# Patient Record
Sex: Male | Born: 1980
Health system: Southern US, Community
[De-identification: ages and names within clinical notes are randomized; demographics above are authoritative.]

## PROBLEM LIST (undated history)

## (undated) DIAGNOSIS — G8929 Other chronic pain: Secondary | ICD-10-CM

## (undated) HISTORY — PX: MYRINGOTOMY: SUR874

## (undated) HISTORY — PX: DENTAL SURGERY: SHX609

---

## 2009-11-19 ENCOUNTER — Encounter: Admission: RE | Admit: 2009-11-19 | Discharge: 2009-11-19 | Payer: Self-pay | Admitting: Neurosurgery

## 2010-11-02 IMAGING — CT CT L SPINE W/ CM
3 of 15 series · 6 of 33 positions shown, 7 images · IV contrast (omnipaque)
Comparison: MRI of the lumbar spine from [HOSPITAL] 06/06/2009.

Addendum Begins

The contrast material injected was 10 ml of Omnipaque 300.
Addendum Ends
CLINICAL DATA: Neck pain and associated headaches.  Low back pain
with extension to the left lower extremity.
MYELOGRAM INJECTION
TECHNIQUE: Informed consent was obtained from the patient prior to
the procedure, including potential complications of headache,
allergy, infection and pain.  A timeout procedure was performed.
With the patient prone, the lower back was prepped with Betadine.
1% Lidocaine was used for local anesthesia.  Lumbar puncture was
performed at the right paramidline level using a 22 gauge needle
with return of clear CSF.  15 ml of Omnipaque 840was injected into
the subarachnoid space .
TECHNIQUE: Following injection of intrathecal Omnipaque contrast,
spine imaging in multiple projections was performed using
fluoroscopy.
Fluoroscopy Time: 2.08 minutes.
TECHNIQUE: CT imaging of the cervical spine was performed after
intrathecal contrast administration. Multiplanar CT image
reconstructions were also generated.
TECHNIQUE: CT imaging of the lumbar spine was performed after
intrathecal contrast administration.  Multiplanar CT image

[Series 6: l spine bone · axial · 0.27mm/px · z∈[-274,-44]mm · 3 of 93 slices shown, 4 images]
[im 1/93  soft-tissue]
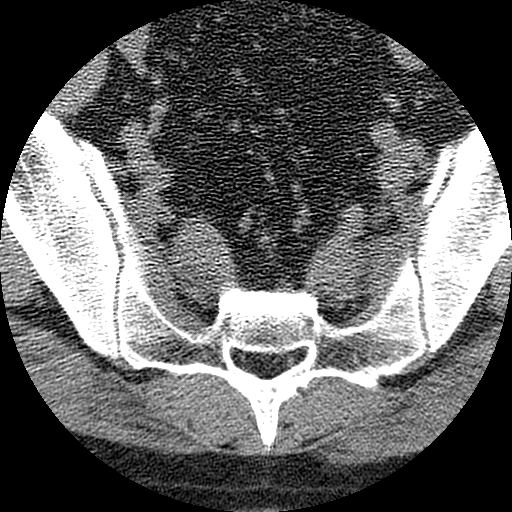
[im 1/93  bone]
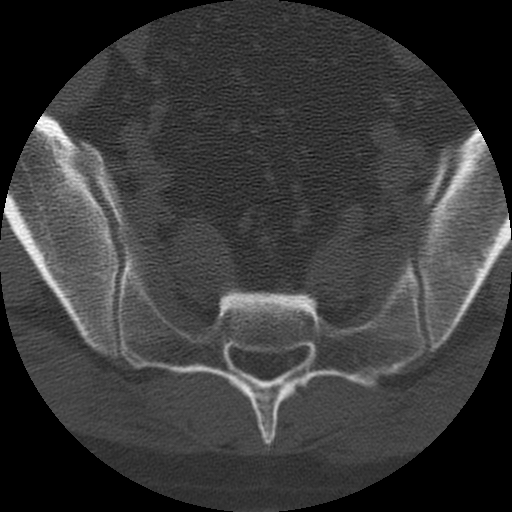
[im 47/93  bone]
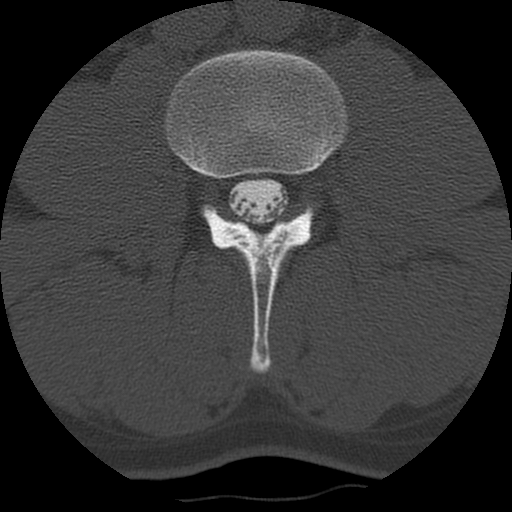
[im 93/93  bone]
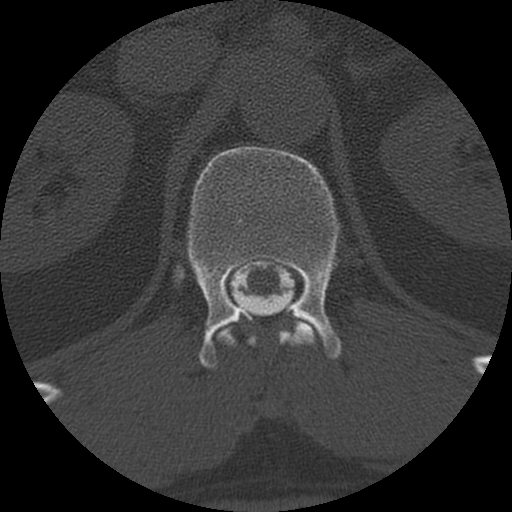

[Series 400: cor c-spine · coronal · 0.37mm/px · 2 of 31 slices shown]
[im 11/31  bone]
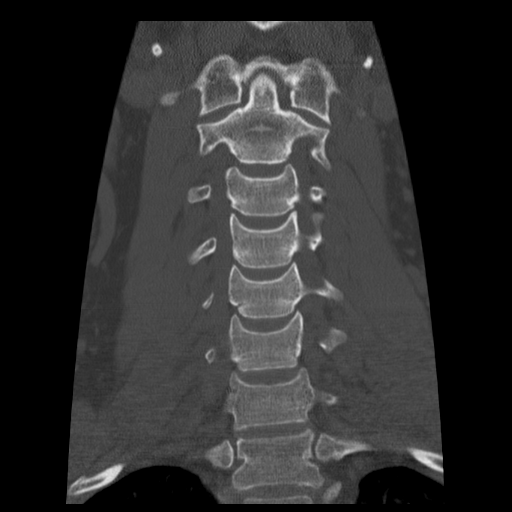
[im 21/31  bone]
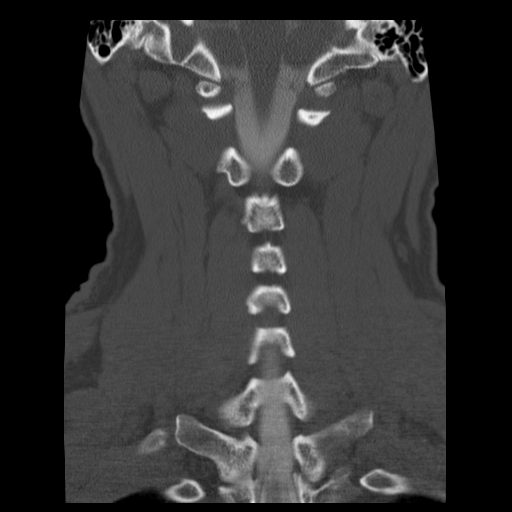

[Series 801: cor lower l-spine · coronal · 0.46mm/px · 1 of 40 slices shown]
[im 20/40  bone]
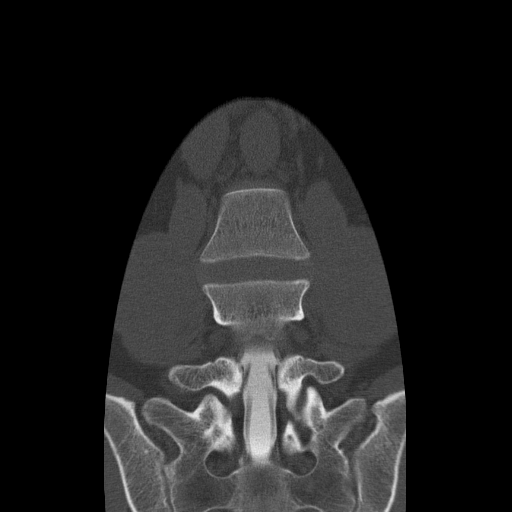

[6 of 33 positions shown; findings below may reference images not displayed]

IMPRESSION: Successful injection of  intrathecal contrast for myelography.

MYELOGRAM CERVICAL AND LUMBAR
FINDINGS: Minimal disc bulging and some end plate irregularity is
evident at L4-5.  The nerve roots fill appropriately on both sides.
Alignment is anatomic.  The vertebral body heights are maintained.
Slight endplate irregularity is noted at L4-5.  No other
significant disc disease is present.

The nerve roots of the cervical spine are opacified normally on
both sides.  The lateral view demonstrates minimal disc bulging at
C4-5.  No other significant disease is evident.
IMPRESSION: 1.  Minimal disc bulging at L4-5 without significant stenosis or
nerve root disruption.
2.  Minimal disc osteophyte complex at C4-5.

CT MYELOGRAPHY CERVICAL SPINE
FINDINGS: The cervical spine is imaged from the skull base through
T1-2.  The vertebral body heights and alignment maintained.
Craniocervical junction is within normal limits.  The soft tissues
are unremarkable.  Individual disc levels are as follows.

C2-3:  Negative.

C3-4:  Minimal uncovertebral spurring is present.  Slight facet
hypertrophy is seen as well.  The central canal is patent.  Minimal
foraminal narrowing is worse on the right.

C4-5:  A slight disc osteophyte complex is present.  Uncovertebral
spurring is worse on the left.  Minimal left foraminal narrowing is
evident.  The central canal and right foramen are patent.

C5-6:  A slight central disc bulge is evident.  This partially
effaces the ventral CSF without significant impact on the cord.
Mild left foraminal narrowing is secondary to uncovertebral
disease.

C6-7:  Negative.

C7-T1:  Negative.
IMPRESSION: 1.  Minimal spondylosis as described.
2.  Minimal foraminal narrowing bilaterally at C3-4, worse on the
right.
3.  Minimal left foraminal narrowing at C4-5 and C5-6 due to
uncovertebral disease and facet hypertrophy.

CT MYELOGRAPHY LUMBAR SPINE
FINDINGS: The lumbar spine is imaged from midbody of T12-S2.
Schmorl's nodes are present at T12-L1.  There is a shallow
Schmorl's node along the superior endplate of L2.  A bone fragment
is seen anteriorly along the superior endplate of L5.  No other
significant ring apophyses are evident.  This may represent remote
trauma.  The vertebral body heights are otherwise maintained.
Alignment is anatomic.  The conus medullaris terminates at an
appropriate level, L1.  Individual disc levels are as follows.
Individual disc levels are as follows.

T12-L1:  Minimal disc bulge is present.  No focal stenosis is
evident.

L1-2:  Negative.

L2-3:  Negative.

L3-4:  Negative.

L4-5:  Slight broad-based disc bulging is present.  This distorts
the central canal without significant to focal stenosis.  Mild
facet hypertrophy contributes.

L5-S1:  Minimal disc bulging is present.  Mild facet hypertrophy is
worse on the right.  No focal stenosis is present.
IMPRESSION: 1.  Slight broad-based disc bulging at L4-5 with minimal distortion
of the central canal but no focal stenosis.
2.  Mild facet hypertrophy at L4-5 and L5-S1 without focal stenosis
at either level.
3.  Fragmentation of the bone along the anterior aspect of the L5
vertebral body.  This may be congenital or related to remote
trauma.  Please see the above discussion.

## 2015-12-27 DIAGNOSIS — J0121 Acute recurrent ethmoidal sinusitis: Secondary | ICD-10-CM | POA: Diagnosis not present

## 2016-05-30 DIAGNOSIS — S20462A Insect bite (nonvenomous) of left back wall of thorax, initial encounter: Secondary | ICD-10-CM | POA: Diagnosis not present

## 2016-05-30 DIAGNOSIS — M542 Cervicalgia: Secondary | ICD-10-CM | POA: Diagnosis not present

## 2016-11-24 DIAGNOSIS — J019 Acute sinusitis, unspecified: Secondary | ICD-10-CM | POA: Diagnosis not present

## 2017-10-12 DIAGNOSIS — J0101 Acute recurrent maxillary sinusitis: Secondary | ICD-10-CM | POA: Diagnosis not present

## 2017-12-09 DIAGNOSIS — H9012 Conductive hearing loss, unilateral, left ear, with unrestricted hearing on the contralateral side: Secondary | ICD-10-CM | POA: Diagnosis not present

## 2017-12-09 DIAGNOSIS — H7202 Central perforation of tympanic membrane, left ear: Secondary | ICD-10-CM | POA: Diagnosis not present

## 2018-01-18 ENCOUNTER — Ambulatory Visit (INDEPENDENT_AMBULATORY_CARE_PROVIDER_SITE_OTHER): Payer: Medicare Other | Admitting: Otolaryngology

## 2018-01-18 DIAGNOSIS — H7202 Central perforation of tympanic membrane, left ear: Secondary | ICD-10-CM | POA: Diagnosis not present

## 2018-01-18 DIAGNOSIS — H9072 Mixed conductive and sensorineural hearing loss, unilateral, left ear, with unrestricted hearing on the contralateral side: Secondary | ICD-10-CM | POA: Diagnosis not present

## 2018-05-17 ENCOUNTER — Ambulatory Visit (INDEPENDENT_AMBULATORY_CARE_PROVIDER_SITE_OTHER): Payer: Medicare Other | Admitting: Otolaryngology

## 2018-05-17 DIAGNOSIS — H6122 Impacted cerumen, left ear: Secondary | ICD-10-CM | POA: Diagnosis not present

## 2018-05-17 DIAGNOSIS — H7202 Central perforation of tympanic membrane, left ear: Secondary | ICD-10-CM | POA: Diagnosis not present

## 2020-07-31 DIAGNOSIS — H7202 Central perforation of tympanic membrane, left ear: Secondary | ICD-10-CM | POA: Diagnosis not present

## 2020-07-31 DIAGNOSIS — H9012 Conductive hearing loss, unilateral, left ear, with unrestricted hearing on the contralateral side: Secondary | ICD-10-CM | POA: Diagnosis not present

## 2020-08-01 ENCOUNTER — Other Ambulatory Visit: Payer: Self-pay | Admitting: Otolaryngology

## 2020-08-03 ENCOUNTER — Other Ambulatory Visit: Payer: Self-pay

## 2020-08-03 ENCOUNTER — Encounter (HOSPITAL_BASED_OUTPATIENT_CLINIC_OR_DEPARTMENT_OTHER): Payer: Self-pay | Admitting: Otolaryngology

## 2020-08-07 ENCOUNTER — Other Ambulatory Visit (HOSPITAL_COMMUNITY)
Admission: RE | Admit: 2020-08-07 | Discharge: 2020-08-07 | Disposition: A | Discharge status: Home / Self Care | Payer: Medicare Other | Source: Ambulatory Visit | Attending: Otolaryngology | Admitting: Otolaryngology

## 2020-08-07 DIAGNOSIS — Z01812 Encounter for preprocedural laboratory examination: Secondary | ICD-10-CM | POA: Diagnosis not present

## 2020-08-07 DIAGNOSIS — Z20822 Contact with and (suspected) exposure to covid-19: Secondary | ICD-10-CM | POA: Insufficient documentation

## 2020-08-07 LAB — SARS CORONAVIRUS 2 (TAT 6-24 HRS): SARS Coronavirus 2: NEGATIVE

## 2020-08-10 ENCOUNTER — Ambulatory Visit (HOSPITAL_BASED_OUTPATIENT_CLINIC_OR_DEPARTMENT_OTHER): Payer: Medicare Other | Admitting: Anesthesiology

## 2020-08-10 ENCOUNTER — Encounter (HOSPITAL_BASED_OUTPATIENT_CLINIC_OR_DEPARTMENT_OTHER): Payer: Self-pay | Admitting: Otolaryngology

## 2020-08-10 ENCOUNTER — Ambulatory Visit (HOSPITAL_BASED_OUTPATIENT_CLINIC_OR_DEPARTMENT_OTHER)
Admission: RE | Admit: 2020-08-10 | Discharge: 2020-08-10 | Disposition: A | Discharge status: Home / Self Care | Payer: Medicare Other | Attending: Otolaryngology | Admitting: Otolaryngology

## 2020-08-10 ENCOUNTER — Other Ambulatory Visit: Payer: Self-pay

## 2020-08-10 ENCOUNTER — Encounter (HOSPITAL_BASED_OUTPATIENT_CLINIC_OR_DEPARTMENT_OTHER)
Admission: RE | Disposition: A | Discharge status: Home / Self Care | Payer: Self-pay | Source: Home / Self Care | Attending: Otolaryngology

## 2020-08-10 DIAGNOSIS — Z87891 Personal history of nicotine dependence: Secondary | ICD-10-CM | POA: Insufficient documentation

## 2020-08-10 DIAGNOSIS — H7292 Unspecified perforation of tympanic membrane, left ear: Secondary | ICD-10-CM | POA: Insufficient documentation

## 2020-08-10 DIAGNOSIS — H7202 Central perforation of tympanic membrane, left ear: Secondary | ICD-10-CM | POA: Diagnosis not present

## 2020-08-10 DIAGNOSIS — H9012 Conductive hearing loss, unilateral, left ear, with unrestricted hearing on the contralateral side: Secondary | ICD-10-CM | POA: Insufficient documentation

## 2020-08-10 HISTORY — PX: TYMPANOPLASTY: SHX33

## 2020-08-10 HISTORY — DX: Other chronic pain: G89.29

## 2020-08-10 SURGERY — TYMPANOPLASTY
Anesthesia: General | Site: Ear | Laterality: Left

## 2020-08-10 MED ORDER — LIDOCAINE 2% (20 MG/ML) 5 ML SYRINGE
INTRAMUSCULAR | Status: DC | PRN
  Administered 2020-08-10: 100 mg via INTRAVENOUS

## 2020-08-10 MED ORDER — EPHEDRINE 5 MG/ML INJ
INTRAVENOUS | Status: AC
  Filled 2020-08-10: qty 10

## 2020-08-10 MED ORDER — CIPROFLOXACIN-FLUOCINOLONE PF 0.3-0.025 % OT SOLN
OTIC | Status: AC
  Filled 2020-08-10: qty 0.25

## 2020-08-10 MED ORDER — DEXMEDETOMIDINE (PRECEDEX) IN NS 20 MCG/5ML (4 MCG/ML) IV SYRINGE
PREFILLED_SYRINGE | INTRAVENOUS | Status: DC | PRN
  Administered 2020-08-10: 12 ug via INTRAVENOUS
  Administered 2020-08-10: 4 ug via INTRAVENOUS

## 2020-08-10 MED ORDER — LIDOCAINE 2% (20 MG/ML) 5 ML SYRINGE
INTRAMUSCULAR | Status: AC
  Filled 2020-08-10: qty 5

## 2020-08-10 MED ORDER — BACITRACIN ZINC 500 UNIT/GM EX OINT
TOPICAL_OINTMENT | CUTANEOUS | Status: AC
  Filled 2020-08-10: qty 28.35

## 2020-08-10 MED ORDER — MIDAZOLAM HCL 5 MG/5ML IJ SOLN
INTRAMUSCULAR | Status: DC | PRN
  Administered 2020-08-10: 2 mg via INTRAVENOUS

## 2020-08-10 MED ORDER — DEXAMETHASONE SODIUM PHOSPHATE 4 MG/ML IJ SOLN
INTRAMUSCULAR | Status: DC | PRN
  Administered 2020-08-10: 10 mg via INTRAVENOUS

## 2020-08-10 MED ORDER — OXYCODONE HCL 5 MG/5ML PO SOLN: 5.0000 mg | Freq: Once | ORAL | Status: DC | PRN

## 2020-08-10 MED ORDER — PROPOFOL 500 MG/50ML IV EMUL
INTRAVENOUS | Status: AC
  Filled 2020-08-10: qty 100

## 2020-08-10 MED ORDER — ONDANSETRON HCL 4 MG/2ML IJ SOLN
INTRAMUSCULAR | Status: DC | PRN
  Administered 2020-08-10: 4 mg via INTRAVENOUS

## 2020-08-10 MED ORDER — CIPROFLOXACIN-FLUOCINOLONE PF 0.3-0.025 % OT SOLN
OTIC | Status: DC | PRN
  Administered 2020-08-10: 1 mL via OTIC

## 2020-08-10 MED ORDER — CLINDAMYCIN HCL 300 MG PO CAPS: 300.0000 mg | ORAL_CAPSULE | Freq: Three times a day (TID) | ORAL | 0 refills | Status: AC

## 2020-08-10 MED ORDER — PHENYLEPHRINE 40 MCG/ML (10ML) SYRINGE FOR IV PUSH (FOR BLOOD PRESSURE SUPPORT)
PREFILLED_SYRINGE | INTRAVENOUS | Status: DC | PRN
  Administered 2020-08-10: 80 ug via INTRAVENOUS

## 2020-08-10 MED ORDER — CIPROFLOXACIN-DEXAMETHASONE 0.3-0.1 % OT SUSP
OTIC | Status: AC
  Filled 2020-08-10: qty 7.5

## 2020-08-10 MED ORDER — DEXAMETHASONE SODIUM PHOSPHATE 10 MG/ML IJ SOLN
INTRAMUSCULAR | Status: AC
  Filled 2020-08-10: qty 1

## 2020-08-10 MED ORDER — PROPOFOL 10 MG/ML IV BOLUS
INTRAVENOUS | Status: DC | PRN
  Administered 2020-08-10: 100 mg via INTRAVENOUS
  Administered 2020-08-10: 200 mg via INTRAVENOUS

## 2020-08-10 MED ORDER — ONDANSETRON HCL 4 MG/2ML IJ SOLN
INTRAMUSCULAR | Status: AC
  Filled 2020-08-10: qty 2

## 2020-08-10 MED ORDER — FENTANYL CITRATE (PF) 100 MCG/2ML IJ SOLN
INTRAMUSCULAR | Status: AC
  Filled 2020-08-10: qty 2

## 2020-08-10 MED ORDER — DEXMEDETOMIDINE (PRECEDEX) IN NS 20 MCG/5ML (4 MCG/ML) IV SYRINGE
PREFILLED_SYRINGE | INTRAVENOUS | Status: AC
  Filled 2020-08-10: qty 5

## 2020-08-10 MED ORDER — LIDOCAINE-EPINEPHRINE 1 %-1:100000 IJ SOLN
INTRAMUSCULAR | Status: DC | PRN
  Administered 2020-08-10: 3 mL

## 2020-08-10 MED ORDER — FENTANYL CITRATE (PF) 100 MCG/2ML IJ SOLN
INTRAMUSCULAR | Status: DC | PRN
Start: 2020-08-10 — End: 2020-08-10
  Administered 2020-08-10 (×2): 25 ug via INTRAVENOUS
  Administered 2020-08-10 (×3): 50 ug via INTRAVENOUS

## 2020-08-10 MED ORDER — BACITRACIN ZINC 500 UNIT/GM EX OINT
TOPICAL_OINTMENT | CUTANEOUS | Status: DC | PRN
  Administered 2020-08-10: 1 via TOPICAL

## 2020-08-10 MED ORDER — CLINDAMYCIN PHOSPHATE 900 MG/50ML IV SOLN
INTRAVENOUS | Status: AC
  Filled 2020-08-10: qty 50

## 2020-08-10 MED ORDER — CLINDAMYCIN PHOSPHATE 900 MG/50ML IV SOLN
INTRAVENOUS | Status: DC | PRN
  Administered 2020-08-10: 900 mg via INTRAVENOUS

## 2020-08-10 MED ORDER — LACTATED RINGERS IV SOLN: INTRAVENOUS | Status: DC

## 2020-08-10 MED ORDER — EPHEDRINE SULFATE-NACL 50-0.9 MG/10ML-% IV SOSY
PREFILLED_SYRINGE | INTRAVENOUS | Status: DC | PRN
  Administered 2020-08-10: 5 mg via INTRAVENOUS

## 2020-08-10 MED ORDER — MEPERIDINE HCL 25 MG/ML IJ SOLN: 6.2500 mg | INTRAMUSCULAR | Status: DC | PRN

## 2020-08-10 MED ORDER — PHENYLEPHRINE 40 MCG/ML (10ML) SYRINGE FOR IV PUSH (FOR BLOOD PRESSURE SUPPORT)
PREFILLED_SYRINGE | INTRAVENOUS | Status: AC
  Filled 2020-08-10: qty 10

## 2020-08-10 MED ORDER — LIDOCAINE-EPINEPHRINE 1 %-1:100000 IJ SOLN
INTRAMUSCULAR | Status: AC
  Filled 2020-08-10: qty 1

## 2020-08-10 MED ORDER — OXYCODONE HCL 5 MG PO TABS: 5.0000 mg | ORAL_TABLET | Freq: Once | ORAL | Status: DC | PRN

## 2020-08-10 MED ORDER — HYDROMORPHONE HCL 1 MG/ML IJ SOLN: 0.2500 mg | INTRAMUSCULAR | Status: DC | PRN

## 2020-08-10 MED ORDER — MIDAZOLAM HCL 2 MG/2ML IJ SOLN
INTRAMUSCULAR | Status: AC
  Filled 2020-08-10: qty 2

## 2020-08-10 MED ORDER — AMISULPRIDE (ANTIEMETIC) 5 MG/2ML IV SOLN: 10.0000 mg | Freq: Once | INTRAVENOUS | Status: DC | PRN

## 2020-08-10 MED ORDER — OXYMETAZOLINE HCL 0.05 % NA SOLN
NASAL | Status: AC
  Filled 2020-08-10: qty 30

## 2020-08-10 MED ORDER — EPINEPHRINE PF 1 MG/ML IJ SOLN
INTRAMUSCULAR | Status: AC
  Filled 2020-08-10: qty 2

## 2020-08-10 SURGICAL SUPPLY — 54 items
ADH SKN CLS APL DERMABOND .7 (GAUZE/BANDAGES/DRESSINGS) ×1
BALL CTTN LRG ABS STRL LF (GAUZE/BANDAGES/DRESSINGS) ×1
BLADE CLIPPER SURG (BLADE) ×3 IMPLANT
BLADE NEEDLE 3 SS STRL (BLADE) IMPLANT
BLADE NEEDLE 3MM SS STRL (BLADE)
CANISTER SUCT 1200ML W/VALVE (MISCELLANEOUS) ×3 IMPLANT
CORD BIPOLAR FORCEPS 12FT (ELECTRODE) IMPLANT
COTTONBALL LRG STERILE PKG (GAUZE/BANDAGES/DRESSINGS) ×3 IMPLANT
COVER WAND RF STERILE (DRAPES) IMPLANT
DECANTER SPIKE VIAL GLASS SM (MISCELLANEOUS) ×3 IMPLANT
DERMABOND ADVANCED (GAUZE/BANDAGES/DRESSINGS) ×2
DERMABOND ADVANCED .7 DNX12 (GAUZE/BANDAGES/DRESSINGS) ×1 IMPLANT
DRAPE MICROSCOPE WILD 40.5X102 (DRAPES) ×3 IMPLANT
DRAPE SURG 17X23 STRL (DRAPES) ×3 IMPLANT
DRSG GLASSCOCK MASTOID ADT (GAUZE/BANDAGES/DRESSINGS) ×3 IMPLANT
DRSG GLASSCOCK MASTOID PED (GAUZE/BANDAGES/DRESSINGS) IMPLANT
ELECT COATED BLADE 2.86 ST (ELECTRODE) ×3 IMPLANT
ELECT REM PT RETURN 9FT ADLT (ELECTROSURGICAL) ×3
ELECTRODE REM PT RTRN 9FT ADLT (ELECTROSURGICAL) ×1 IMPLANT
GAUZE SPONGE 4X4 12PLY STRL (GAUZE/BANDAGES/DRESSINGS) IMPLANT
GAUZE SPONGE 4X4 12PLY STRL LF (GAUZE/BANDAGES/DRESSINGS) IMPLANT
GLOVE BIO SURGEON STRL SZ7.5 (GLOVE) ×3 IMPLANT
GLOVE BIOGEL PI IND STRL 6.5 (GLOVE) ×1 IMPLANT
GLOVE BIOGEL PI IND STRL 7.0 (GLOVE) ×1 IMPLANT
GLOVE BIOGEL PI INDICATOR 6.5 (GLOVE) ×2
GLOVE BIOGEL PI INDICATOR 7.0 (GLOVE) ×2
GLOVE ECLIPSE 6.5 STRL STRAW (GLOVE) ×3 IMPLANT
GOWN STRL REUS W/ TWL LRG LVL3 (GOWN DISPOSABLE) ×2 IMPLANT
GOWN STRL REUS W/TWL LRG LVL3 (GOWN DISPOSABLE) ×6
IV CATH AUTO 14GX1.75 SAFE ORG (IV SOLUTION) ×3 IMPLANT
IV NS 500ML (IV SOLUTION)
IV NS 500ML BAXH (IV SOLUTION) IMPLANT
IV SET EXT 30 76VOL 4 MALE LL (IV SETS) ×3 IMPLANT
NDL SAFETY ECLIPSE 18X1.5 (NEEDLE) ×1 IMPLANT
NEEDLE HYPO 18GX1.5 SHARP (NEEDLE) ×3
NEEDLE HYPO 25X1 1.5 SAFETY (NEEDLE) ×3 IMPLANT
NS IRRIG 1000ML POUR BTL (IV SOLUTION) ×3 IMPLANT
PACK BASIN DAY SURGERY FS (CUSTOM PROCEDURE TRAY) ×3 IMPLANT
PACK ENT DAY SURGERY (CUSTOM PROCEDURE TRAY) ×3 IMPLANT
PENCIL SMOKE EVACUATOR (MISCELLANEOUS) ×3 IMPLANT
SLEEVE IRRIGATION ELITE 7 (MISCELLANEOUS) IMPLANT
SLEEVE SCD COMPRESS KNEE MED (MISCELLANEOUS) ×3 IMPLANT
SPONGE SURGIFOAM ABS GEL 12-7 (HEMOSTASIS) ×3 IMPLANT
SUT VIC AB 3-0 SH 27 (SUTURE)
SUT VIC AB 3-0 SH 27X BRD (SUTURE) IMPLANT
SUT VIC AB 4-0 P-3 18XBRD (SUTURE) IMPLANT
SUT VIC AB 4-0 P3 18 (SUTURE)
SUT VICRYL 4-0 PS2 18IN ABS (SUTURE) ×3 IMPLANT
SYR 3ML 18GX1 1/2 (SYRINGE) ×6 IMPLANT
SYR 5ML LL (SYRINGE) ×3 IMPLANT
SYR BULB EAR ULCER 3OZ GRN STR (SYRINGE) IMPLANT
TOWEL GREEN STERILE FF (TOWEL DISPOSABLE) ×3 IMPLANT
TRAY DSU PREP LF (CUSTOM PROCEDURE TRAY) ×3 IMPLANT
TUBING IRRIGATION (MISCELLANEOUS) IMPLANT

## 2020-08-10 NOTE — Anesthesia Procedure Notes (Signed)
Procedure Name: LMA Insertion Date/Time: 08/10/2020 8:01 AM Performed by: Caren Macadam, CRNA Pre-anesthesia Checklist: Patient identified, Emergency Drugs available, Suction available and Patient being monitored Patient Re-evaluated:Patient Re-evaluated prior to induction Oxygen Delivery Method: Circle system utilized Preoxygenation: Pre-oxygenation with 100% oxygen Induction Type: IV induction Ventilation: Mask ventilation without difficulty LMA: LMA inserted LMA Size: 4.0 Number of attempts: 1 Airway Equipment and Method: Bite block Placement Confirmation: positive ETCO2 and breath sounds checked- equal and bilateral Tube secured with: Tape Dental Injury: Teeth and Oropharynx as per pre-operative assessment

## 2020-08-10 NOTE — Transfer of Care (Signed)
Immediate Anesthesia Transfer of Care Note  Patient: Jacob Dawson  Procedure(s) Performed: TYMPANOPLASTY (Left Ear)  Patient Location: PACU  Anesthesia Type:General  Level of Consciousness: awake  Airway & Oxygen Therapy: Patient Spontanous Breathing and Patient connected to face mask oxygen  Post-op Assessment: Report given to RN and Post -op Vital signs reviewed and stable  Post vital signs: Reviewed and stable  Last Vitals:  Vitals Value Taken Time  BP 119/83 08/10/20 0936  Temp    Pulse 77 08/10/20 0937  Resp 14 08/10/20 0937  SpO2 96 % 08/10/20 0937  Vitals shown include unvalidated device data.  Last Pain:  Vitals:   08/10/20 0704  TempSrc: Oral  PainSc: 0-No pain         Complications: No complications documented.

## 2020-08-10 NOTE — Discharge Instructions (Addendum)
Post Anesthesia Home Care Instructions  Activity: Get plenty of rest for the remainder of the day. A responsible individual must stay with you for 24 hours following the procedure.  For the next 24 hours, DO NOT: -Drive a car -Advertising copywriter -Drink alcoholic beverages -Take any medication unless instructed by your physician -Make any legal decisions or sign important papers.  Meals: Start with liquid foods such as gelatin or soup. Progress to regular foods as tolerated. Avoid greasy, spicy, heavy foods. If nausea and/or vomiting occur, drink only clear liquids until the nausea and/or vomiting subsides. Call your physician if vomiting continues.  Special Instructions/Symptoms: Your throat may feel dry or sore from the anesthesia or the breathing tube placed in your throat during surgery. If this causes discomfort, gargle with warm salt water. The discomfort should disappear within 24 hours.  If you had a scopolamine patch placed behind your ear for the management of post- operative nausea and/or vomiting:  1. The medication in the patch is effective for 72 hours, after which it should be removed.  Wrap patch in a tissue and discard in the trash. Wash hands thoroughly with soap and water. 2. You may remove the patch earlier than 72 hours if you experience unpleasant side effects which may include dry mouth, dizziness or visual disturbances. 3. Avoid touching the patch. Wash your hands with soap and water after contact with the patch.    --------------  POSTOPERATIVE INSTRUCTIONS FOR PATIENTS HAVING A MYRINGOPLASTY AND TYMPANOPLASTY 1. Avoid undue fatigue or exposure to colds or upper respiratory infections if possible. 2. Do not blow your nose for approximately one week following surgery. Any accumulated secretions in the nose should be drawn back and expectorated through the mouth to avoid infecting the ear. If you sneeze, do so with your mouth open. Do not hold your nose to avoid  sneezing. Do not play musical wind instruments for 3 weeks. 3. Wash your hands with soap and water before treating the ear. 4. A clean cloth moistened with warm water may be used to clean the outer ear as often as necessary for cleanliness and comfort. Do not allow water to enter the ear canal for at least three weeks. 5. You may shampoo your hair 48 hours following surgery, provided that water is not allowed to enter your ear canal. Water can be kept out of your ear canal by placing a cotton ball in the ear opening and applying Vaseline over the cotton to form a water tight seal. 6. If ear drops are to be instilled, position the head with the affected ear up during the instillation and remain in this position for five to ten minutes to facilitate the absorption of the drops. Then place a clean cotton ball in the ear for about an hour. 7. The ear should be exposed to the air as much as possible. A cotton ball should be placed in the ear canal during the day while combing the hair, during exposure to a dusty environment, and at night to prevent drainage onto your pillow. At first, the drainage may be red-brown to brown in color, but the brown drainage usually becomes clear and disappears within a week or two. If drainage increases, call our office, [phone]. 8. If your physician prescribes an antibiotic, fill the prescription promptly and take all of the medicine as directed until the entire supply is gone. 9. If any of the following should occur, contact your physician: a. Persistent bleeding b. Persistent fever c.  Purulent drainage (pus) from the ear or incision d. Increasing redness around the suture line e. Persistent pain or dizziness f. Facial weakness g. Rash around the ear or incision 10. Do not be overly concerned about your hearing until at least one month postoperatively. Your hearing may fluctuate as the ear heals. You may also experience some popping and cracking sounds in the ear  for up to several weeks. It may sound like you are "talking in a barrel" or a tunnel. This is normal and should not cause concern. 11. You may notice a metallic taste in your mouth for several weeks after ear surgery. The taste will usually go away spontaneously. 12. Please ask your surgeon if any of the middle ear ossicles were replaced with metal parts. This may be important to know if you ever need to have a magnetic resonance imaging scan (MRI) in the future. 13. It is important for you to return for your scheduled appointments.    Post Anesthesia Home Care Instructions  Activity: Get plenty of rest for the remainder of the day. A responsible individual must stay with you for 24 hours following the procedure.  For the next 24 hours, DO NOT: -Drive a car -Advertising copywriter -Drink alcoholic beverages -Take any medication unless instructed by your physician -Make any legal decisions or sign important papers.  Meals: Start with liquid foods such as gelatin or soup. Progress to regular foods as tolerated. Avoid greasy, spicy, heavy foods. If nausea and/or vomiting occur, drink only clear liquids until the nausea and/or vomiting subsides. Call your physician if vomiting continues.  Special Instructions/Symptoms: Your throat may feel dry or sore from the anesthesia or the breathing tube placed in your throat during surgery. If this causes discomfort, gargle with warm salt water. The discomfort should disappear within 24 hours.  If you had a scopolamine patch placed behind your ear for the management of post- operative nausea and/or vomiting:  1. The medication in the patch is effective for 72 hours, after which it should be removed.  Wrap patch in a tissue and discard in the trash. Wash hands thoroughly with soap and water. 2. You may remove the patch earlier than 72 hours if you experience unpleasant side effects which may include dry mouth, dizziness or visual disturbances. 3. Avoid  touching the patch. Wash your hands with soap and water after contact with the patch.

## 2020-08-10 NOTE — Anesthesia Postprocedure Evaluation (Signed)
Anesthesia Post Note  Patient: Jacob Dawson  Procedure(s) Performed: TYMPANOPLASTY (Left Ear)     Patient location during evaluation: PACU Anesthesia Type: General Level of consciousness: awake and alert Pain management: pain level controlled Vital Signs Assessment: post-procedure vital signs reviewed and stable Respiratory status: spontaneous breathing, nonlabored ventilation and respiratory function stable Cardiovascular status: blood pressure returned to baseline and stable Postop Assessment: no apparent nausea or vomiting Anesthetic complications: no   No complications documented.  Last Vitals:  Vitals:   08/10/20 0953 08/10/20 1010  BP: 123/70 131/85  Pulse: 73 84  Resp: 14 16  Temp:  36.8 C  SpO2: 95% 96%    Last Pain:  Vitals:   08/10/20 1010  TempSrc:   PainSc: 0-No pain                 Lowella Curb

## 2020-08-10 NOTE — H&P (Signed)
Cc: Left TM perforation  HPI: The patient is a 39 year old male who returns today for his follow-up evaluation. The patient was previously seen for his traumatic left ear injury.  He was noted to have a large left tympanic membrane perforation.  He was also noted to have significant left ear conductive hearing loss.  The patient was placed on dry ear precautions.  According to the patient, he had accidental  water exposure to his left ear 2 weeks ago while [place] the beach.  It resulted in left ear drainage for a few days.  The drainage has resolved.  He continues to have hearing difficulty on the left side.  He currently denies any otalgia or vertigo.  He is interested in having the tympanic membrane perforation repaired.   Exam: General: Communicates without difficulty, well nourished, no acute distress. Head: Normocephalic, no evidence injury, no tenderness, facial buttresses intact without stepoff. Face/sinus: No tenderness to palpation and percussion. Facial movement is normal and symmetric. Eyes: PERRL, EOMI. No scleral icterus, conjunctivae clear. Neuro: CN II exam reveals vision grossly intact. No nystagmus at any point of gaze. Ears:  A large 70% left posterior tympanic membrane perforation is noted. Nose: External evaluation reveals normal support and skin without lesions. Dorsum is intact. Anterior rhinoscopy reveals healthy pink mucosa over anterior aspect of inferior turbinates and intact septum. No purulence noted. Oral:  Oral cavity and oropharynx are intact, symmetric, without erythema or edema. Mucosa is moist without lesions. Neck: Full range of motion without pain. There is no significant lymphadenopathy. No masses palpable. Thyroid bed within normal limits to palpation. Parotid glands and submandibular glands equal bilaterally without mass. Trachea is midline. Neuro:  CN 2-12 grossly intact. Gait normal.   AUDIOMETRIC TESTING:  I reviewed the audiometric result. The test shows  significant left ear conductive hearing loss. His right ear hearing is normal. The speech reception threshold is 0dB AD and 30dB AS. The discrimination score is 100% AD and 96% AS. The tympanogram is normal on the right.  Assessment 1.  The patient continues to have a large 70% left posterior tympanic membrane perforation.  2.  Persistent left ear conductive hearing loss.  3.  No acute infection is noted today.   Plan  1.  The physical exam and hearing test results are reviewed with the patient.  2.  He should continue to observe dry ear precaution on the left side.  3.  In light of his persistent perforation and hearing loss, he may benefit from undergoing left ear tympanoplasty to close the tympanic membrane perforation.  The risks, benefits, alternatives and details of the procedure are reviewed with the patient.  Questions are invited and answered.  4.  The patient would like to proceed with the procedure.

## 2020-08-10 NOTE — Op Note (Signed)
DATE OF PROCEDURE: 08/10/2020  OPERATIVE REPORT   SURGEON: Newman Pies, MD  PREOPERATIVE DIAGNOSIS: Left tympanic membrane perforation.  POSTOPERATIVE DIAGNOSIS: Left tympanic membrane perforation.  PROCEDURES PERFORMED: 1. Left transcanal tympanoplasty. 2. Left postauricular temporalis fascia graft harvesting.  ANESTHESIA: General laryngeal mask anesthesia.  COMPLICATIONS: None.  ESTIMATED BLOOD LOSS: Minimal.  INDICATION FOR PROCEDURE:  Jacob Dawson is a 39 y.o. male with a history of left ear trauma, resulting in a large left tympanic membrane perforation.  At the last visit, a nearly 75% inferior left TM perforation was noted. The patient was also noted to have conductive hearing loss secondary to the tympanic membrane perforation. Based on the above findings, the decision was made for the patient to undergo the above-stated procedures. The risks, benefits, alternatives, and details of the procedures were discussed with the mother. Questions were invited and answered. Informed consent was obtained.  DESCRIPTION OF PROCEDURE: The patient was taken to the operating room and placed supine on the operating table. General laryngeal mask anesthesia was induced by the anesthesiologist. Under the operating microscope, the left ear canal was cleaned of all cerumen. A rim of fibrotic tissue was removed circumferentially from the perforation. A standard tympanomeatal flap was elevated in a standard fashion. No other pathology was noted.  Attention was then focused on obtaining the temporalis fascia graft. A separate postauricular incision was made. Incision was carried down to the level of the temporalis fascia. A 2 x 2 cm temporalis fascia graft was harvested in a standard fashion. Hemostasis was achieved with Bovie electrocautery. The surgical site was copiously irrigated. The incision was closed in layers with 4-0 Vicryl and Dermabond.  Under the operating  microscope, the harvested graft was inserted via the ear canal into the middle ear space. It was used to cover the TM perforation. Gelfoam was used to pack the middle ear and ear canal, sandwiching the neotympanum. Otovel ear drops were applied. Antibiotic ointment was applied to the ear canal. That concluded the procedure for the patient. The care of the patient was turned over to the anesthesiologist. The patient was awakened from anesthesia without difficulty. He was extubated and transferred to the recovery room in good condition.  OPERATIVE FINDINGS: A 75% left inferior TM perforation was noted.  SPECIMEN: None.  FOLLOWUP CARE: The patient will be discharged home once he is awake and alert. He will follow up in my office in 1 week.

## 2020-08-10 NOTE — Anesthesia Preprocedure Evaluation (Signed)
Anesthesia Evaluation  Patient identified by MRN, date of birth, ID band Patient awake    Reviewed: Allergy & Precautions, NPO status , Patient's Chart, lab work & pertinent test results  Airway Mallampati: II  TM Distance: >3 FB Neck ROM: Full    Dental no notable dental hx.    Pulmonary neg pulmonary ROS, former smoker,    Pulmonary exam normal breath sounds clear to auscultation       Cardiovascular negative cardio ROS Normal cardiovascular exam Rhythm:Regular Rate:Normal     Neuro/Psych negative neurological ROS  negative psych ROS   GI/Hepatic negative GI ROS, Neg liver ROS,   Endo/Other  negative endocrine ROS  Renal/GU negative Renal ROS  negative genitourinary   Musculoskeletal negative musculoskeletal ROS (+)   Abdominal   Peds negative pediatric ROS (+)  Hematology negative hematology ROS (+)   Anesthesia Other Findings   Reproductive/Obstetrics negative OB ROS                             Anesthesia Physical Anesthesia Plan  ASA: II  Anesthesia Plan: General   Post-op Pain Management:    Induction: Intravenous  PONV Risk Score and Plan: 2 and Ondansetron, Midazolam and Treatment may vary due to age or medical condition  Airway Management Planned: LMA  Additional Equipment:   Intra-op Plan:   Post-operative Plan: Extubation in OR  Informed Consent: I have reviewed the patients History and Physical, chart, labs and discussed the procedure including the risks, benefits and alternatives for the proposed anesthesia with the patient or authorized representative who has indicated his/her understanding and acceptance.     Dental advisory given  Plan Discussed with: CRNA  Anesthesia Plan Comments:         Anesthesia Quick Evaluation  

## 2020-08-14 ENCOUNTER — Encounter (HOSPITAL_BASED_OUTPATIENT_CLINIC_OR_DEPARTMENT_OTHER): Payer: Self-pay | Admitting: Otolaryngology

## 2020-11-16 DIAGNOSIS — H903 Sensorineural hearing loss, bilateral: Secondary | ICD-10-CM | POA: Diagnosis not present

## 2020-11-16 DIAGNOSIS — H7202 Central perforation of tympanic membrane, left ear: Secondary | ICD-10-CM | POA: Diagnosis not present

## 2022-01-29 DIAGNOSIS — H35711 Central serous chorioretinopathy, right eye: Secondary | ICD-10-CM | POA: Diagnosis not present

## 2022-01-31 DIAGNOSIS — H43822 Vitreomacular adhesion, left eye: Secondary | ICD-10-CM | POA: Diagnosis not present

## 2022-01-31 DIAGNOSIS — H35711 Central serous chorioretinopathy, right eye: Secondary | ICD-10-CM | POA: Diagnosis not present

## 2022-01-31 DIAGNOSIS — H2513 Age-related nuclear cataract, bilateral: Secondary | ICD-10-CM | POA: Diagnosis not present

## 2022-03-28 DIAGNOSIS — H43822 Vitreomacular adhesion, left eye: Secondary | ICD-10-CM | POA: Diagnosis not present

## 2022-03-28 DIAGNOSIS — H35711 Central serous chorioretinopathy, right eye: Secondary | ICD-10-CM | POA: Diagnosis not present

## 2022-03-28 DIAGNOSIS — H2513 Age-related nuclear cataract, bilateral: Secondary | ICD-10-CM | POA: Diagnosis not present

## 2022-04-03 DIAGNOSIS — Z23 Encounter for immunization: Secondary | ICD-10-CM | POA: Diagnosis not present

## 2022-04-03 DIAGNOSIS — S61401A Unspecified open wound of right hand, initial encounter: Secondary | ICD-10-CM | POA: Diagnosis not present

## 2022-07-25 ENCOUNTER — Other Ambulatory Visit: Payer: Self-pay

## 2022-07-25 ENCOUNTER — Emergency Department (HOSPITAL_COMMUNITY)
Admission: EM | Admit: 2022-07-25 | Discharge: 2022-07-25 | Disposition: A | Discharge status: Home / Self Care | Payer: Medicare Other | Attending: Emergency Medicine | Admitting: Emergency Medicine

## 2022-07-25 ENCOUNTER — Encounter (HOSPITAL_COMMUNITY): Payer: Self-pay | Admitting: Emergency Medicine

## 2022-07-25 ENCOUNTER — Emergency Department (HOSPITAL_COMMUNITY): Payer: Medicare Other

## 2022-07-25 DIAGNOSIS — R1013 Epigastric pain: Secondary | ICD-10-CM | POA: Diagnosis not present

## 2022-07-25 DIAGNOSIS — R42 Dizziness and giddiness: Secondary | ICD-10-CM | POA: Diagnosis not present

## 2022-07-25 DIAGNOSIS — R0789 Other chest pain: Secondary | ICD-10-CM | POA: Insufficient documentation

## 2022-07-25 DIAGNOSIS — R079 Chest pain, unspecified: Secondary | ICD-10-CM | POA: Diagnosis not present

## 2022-07-25 DIAGNOSIS — R55 Syncope and collapse: Secondary | ICD-10-CM | POA: Insufficient documentation

## 2022-07-25 LAB — BASIC METABOLIC PANEL
Anion gap: 10 (ref 5–15)
BUN: 19 mg/dL (ref 6–20)
CO2: 22 mmol/L (ref 22–32)
Calcium: 8.8 mg/dL — ABNORMAL LOW (ref 8.9–10.3)
Chloride: 100 mmol/L (ref 98–111)
Creatinine, Ser: 1.14 mg/dL (ref 0.61–1.24)
GFR, Estimated: >60 mL/min (ref >60)
Glucose, Bld: 103 mg/dL — ABNORMAL HIGH (ref 70–99)
Potassium: 4 mmol/L (ref 3.5–5.1)
Sodium: 132 mmol/L — ABNORMAL LOW (ref 135–145)

## 2022-07-25 LAB — CBC
HCT: 47.3 % (ref 39.0–52.0)
Hemoglobin: 17.3 g/dL — ABNORMAL HIGH (ref 13.0–17.0)
MCH: 32.6 pg (ref 26.0–34.0)
MCHC: 36.6 g/dL — ABNORMAL HIGH (ref 30.0–36.0)
MCV: 89.1 fL (ref 80.0–100.0)
Platelets: 290 10*3/uL (ref 150–400)
RBC: 5.31 MIL/uL (ref 4.22–5.81)
RDW: 12.8 % (ref 11.5–15.5)
WBC: 10.6 10*3/uL — ABNORMAL HIGH (ref 4.0–10.5)
nRBC: 0 % (ref 0.0–0.2)

## 2022-07-25 LAB — TROPONIN I (HIGH SENSITIVITY)
Troponin I (High Sensitivity): 3 ng/L (ref <18)
Troponin I (High Sensitivity): 3 ng/L (ref <18)

## 2022-07-25 LAB — URINALYSIS, ROUTINE W REFLEX MICROSCOPIC
Bacteria, UA: NONE SEEN
Bilirubin Urine: NEGATIVE
Glucose, UA: NEGATIVE mg/dL
Ketones, ur: 5 mg/dL — AB
Leukocytes,Ua: NEGATIVE
Nitrite: NEGATIVE
Protein, ur: NEGATIVE mg/dL
Specific Gravity, Urine: 1.004 — ABNORMAL LOW (ref 1.005–1.030)
pH: 6 (ref 5.0–8.0)

## 2022-07-25 LAB — CBG MONITORING, ED: Glucose-Capillary: 91 mg/dL (ref 70–99)

## 2022-07-25 NOTE — ED Triage Notes (Signed)
Pt presents with near syncopal episode, per pt was sitting at [place] and start feeling hot and epigastric pain, stood up and felt lightheaded.

## 2022-07-25 NOTE — Discharge Instructions (Addendum)
Your exam, lab tests, EKG and chest x-ray are reassuring.  Make sure you are drinking plenty of fluids to avoid dehydration.

## 2022-07-25 NOTE — ED Notes (Signed)
Water provided to the patient.

## 2022-07-25 NOTE — ED Notes (Signed)
Lab contacted to ask for the delta troponin blood draw.

## 2022-07-25 NOTE — ED Notes (Signed)
Pt made aware a urine specimen is needed when he is able to go.

## 2022-07-25 NOTE — ED Provider Notes (Signed)
[facility] EMERGENCY DEPARTMENT Provider Note   CSN: [id number] Arrival date & time: 07/25/22  1247     History {Add pertinent medical, surgical, social history, OB history to HPI:1} Chief Complaint  Patient presents with  . Near Syncope    Jacob Dawson is a 41 y.o. male presenting for evaluation of a near syncopal event.  Patient states that he had just [place] a church for [place] of a neighbor's wife, he sat down and started feeling hot, developed a fleeting episode of pressure in his left upper abdomen/lower chest region and felt lightheaded.  He denies having nausea, vomiting, vision changes including no blurred vision, no shortness of breath.  He states her blood pressure was checked prior to arrival and it was elevated stating 170 range which states he never has high blood pressure.  He is currently symptom-free.  Prior to going to the church he had walked about 35 minutes on his treadmill which he does most days and was symptom-free during that event.  He ate breakfast this morning and drank several diet Cokes after walking on the treadmill.  He also states he drinks some water before entering the church, doubts he was dehydrated, but does state he drink about 8 beers yesterday over an extended length of time, he was not intoxicated with this alcohol consumption.  He is currently symptom-free.  He does not smoke, no family history of premature cardiac disease.  The history is provided by the patient.       Home Medications Prior to Admission medications   Medication Sig Start Date End Date Taking? Authorizing Provider  diclofenac (VOLTAREN) 75 MG EC tablet Take 75 mg by mouth 2 (two) times daily.    [provider]  HYDROcodone-ibuprofen (VICOPROFEN) 7.5-200 MG tablet Take 1 tablet by mouth every 8 (eight) hours as needed for moderate pain.    [provider]      Allergies    Penicillins and Compazine [prochlorperazine]    Review of Systems    Review of Systems  Physical Exam Updated Vital Signs BP (!) 154/99   Pulse 78   Temp 98.1 F (36.7 C) (Oral)   Resp 16   Ht 5\' 9"  (1.753 m)   Wt 90.7 kg   SpO2 100%   BMI 29.53 kg/m  Physical Exam  ED Results / Procedures / Treatments   Labs (all labs ordered are listed, but only abnormal results are displayed) Labs Reviewed  BASIC METABOLIC PANEL - Abnormal; Notable for the following components:      Result Value   Sodium 132 (*)    Glucose, Bld 103 (*)    Calcium 8.8 (*)    All other components within normal limits  CBC - Abnormal; Notable for the following components:   WBC 10.6 (*)    Hemoglobin 17.3 (*)    MCHC 36.6 (*)    All other components within normal limits  URINALYSIS, ROUTINE W REFLEX MICROSCOPIC  CBG MONITORING, ED  TROPONIN I (HIGH SENSITIVITY)  TROPONIN I (HIGH SENSITIVITY)    EKG None  Radiology No results found.  Procedures Procedures  {Document cardiac monitor, telemetry assessment procedure when appropriate:1}  Medications Ordered in ED Medications - No data to display  ED Course/ Medical Decision Making/ A&P                           Medical Decision Making Amount and/or Complexity of Data Reviewed Labs:  ordered. Radiology: ordered.   ***  {Document critical care time when appropriate:1} {Document review of labs and clinical decision tools ie heart score, Chads2Vasc2 etc:1}  {Document your independent review of radiology images, and any outside records:1} {Document your discussion with family members, caretakers, and with consultants:1} {Document social determinants of health affecting pt's care:1} {Document your decision making why or why not admission, treatments were needed:1} Final Clinical Impression(s) / ED Diagnoses Final diagnoses:  None    Rx / DC Orders ED Discharge Orders     None

## 2022-07-30 DIAGNOSIS — R55 Syncope and collapse: Secondary | ICD-10-CM | POA: Diagnosis not present

## 2023-01-20 DIAGNOSIS — E7849 Other hyperlipidemia: Secondary | ICD-10-CM | POA: Diagnosis not present

## 2023-01-20 DIAGNOSIS — R5381 Other malaise: Secondary | ICD-10-CM | POA: Diagnosis not present

## 2023-01-20 DIAGNOSIS — Z0000 Encounter for general adult medical examination without abnormal findings: Secondary | ICD-10-CM | POA: Diagnosis not present

## 2023-01-20 DIAGNOSIS — E039 Hypothyroidism, unspecified: Secondary | ICD-10-CM | POA: Diagnosis not present

## 2023-01-29 DIAGNOSIS — Z0001 Encounter for general adult medical examination with abnormal findings: Secondary | ICD-10-CM | POA: Diagnosis not present

## 2023-01-29 DIAGNOSIS — Z23 Encounter for immunization: Secondary | ICD-10-CM | POA: Diagnosis not present

## 2023-01-29 DIAGNOSIS — R7301 Impaired fasting glucose: Secondary | ICD-10-CM | POA: Diagnosis not present

## 2024-02-09 DIAGNOSIS — Z0000 Encounter for general adult medical examination without abnormal findings: Secondary | ICD-10-CM | POA: Diagnosis not present

## 2024-02-09 DIAGNOSIS — I10 Essential (primary) hypertension: Secondary | ICD-10-CM | POA: Diagnosis not present

## 2024-02-09 DIAGNOSIS — E7849 Other hyperlipidemia: Secondary | ICD-10-CM | POA: Diagnosis not present

## 2024-02-09 DIAGNOSIS — D72829 Elevated white blood cell count, unspecified: Secondary | ICD-10-CM | POA: Diagnosis not present

## 2024-02-09 DIAGNOSIS — Z0001 Encounter for general adult medical examination with abnormal findings: Secondary | ICD-10-CM | POA: Diagnosis not present

## 2024-06-09 DIAGNOSIS — H00015 Hordeolum externum left lower eyelid: Secondary | ICD-10-CM | POA: Diagnosis not present
